# Patient Record
Sex: Male | Born: 2000 | Race: Black or African American | Hispanic: No | Marital: Single | Smoking: Never smoker
Health system: Southern US, Community
[De-identification: ages and names within clinical notes are randomized; demographics above are authoritative.]

---
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
## 2020-07-27 IMAGING — CR DG CHEST 2V
1 series · 2 of 2 positions shown · non-contrast
Comparison: None.

CLINICAL DATA: Chest pain

EXAM:
CHEST - 2 VIEW

[Series 1: dg chest 2 view · 0.14mm/px · 2 of 2 slices shown]
[im 1/2]
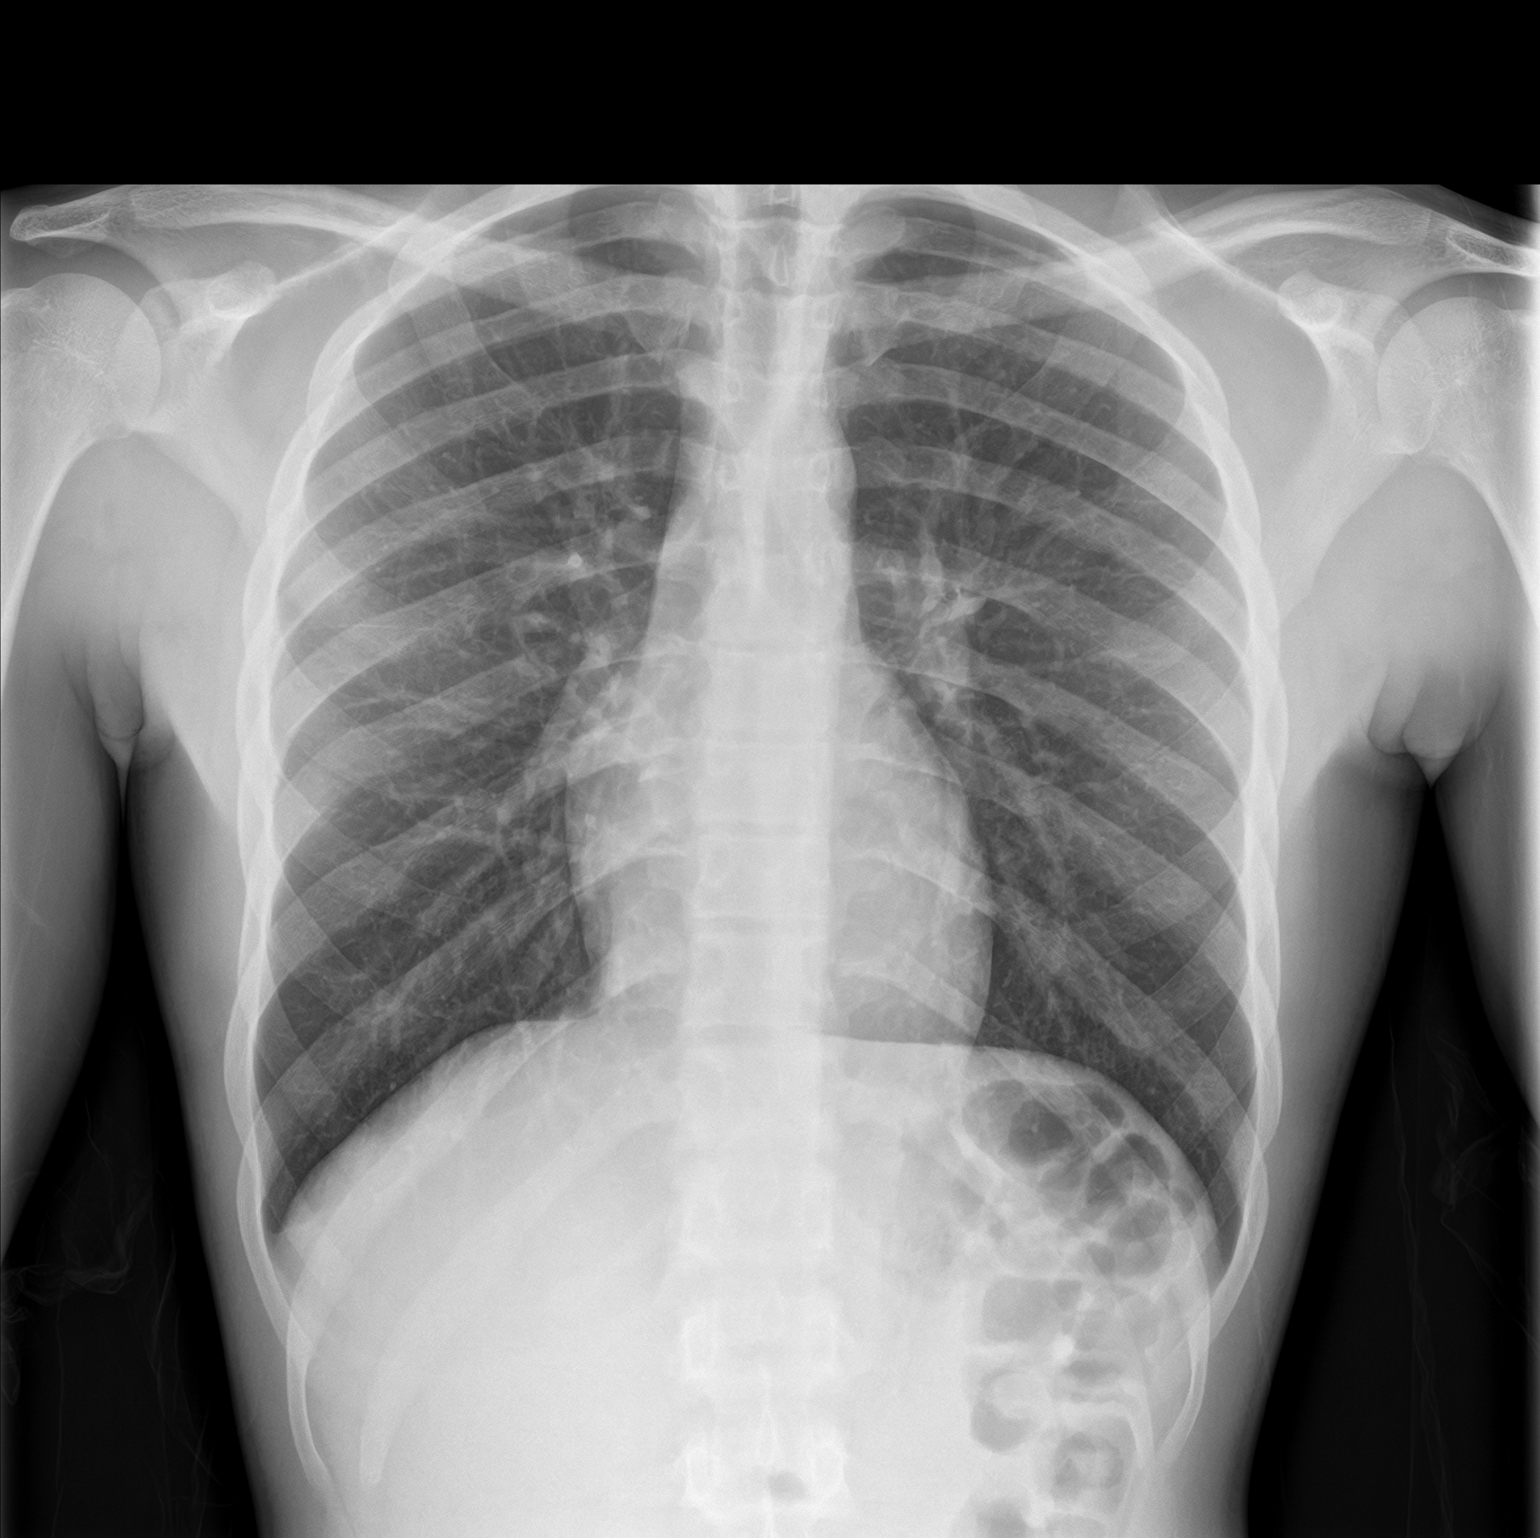
[im 2/2]
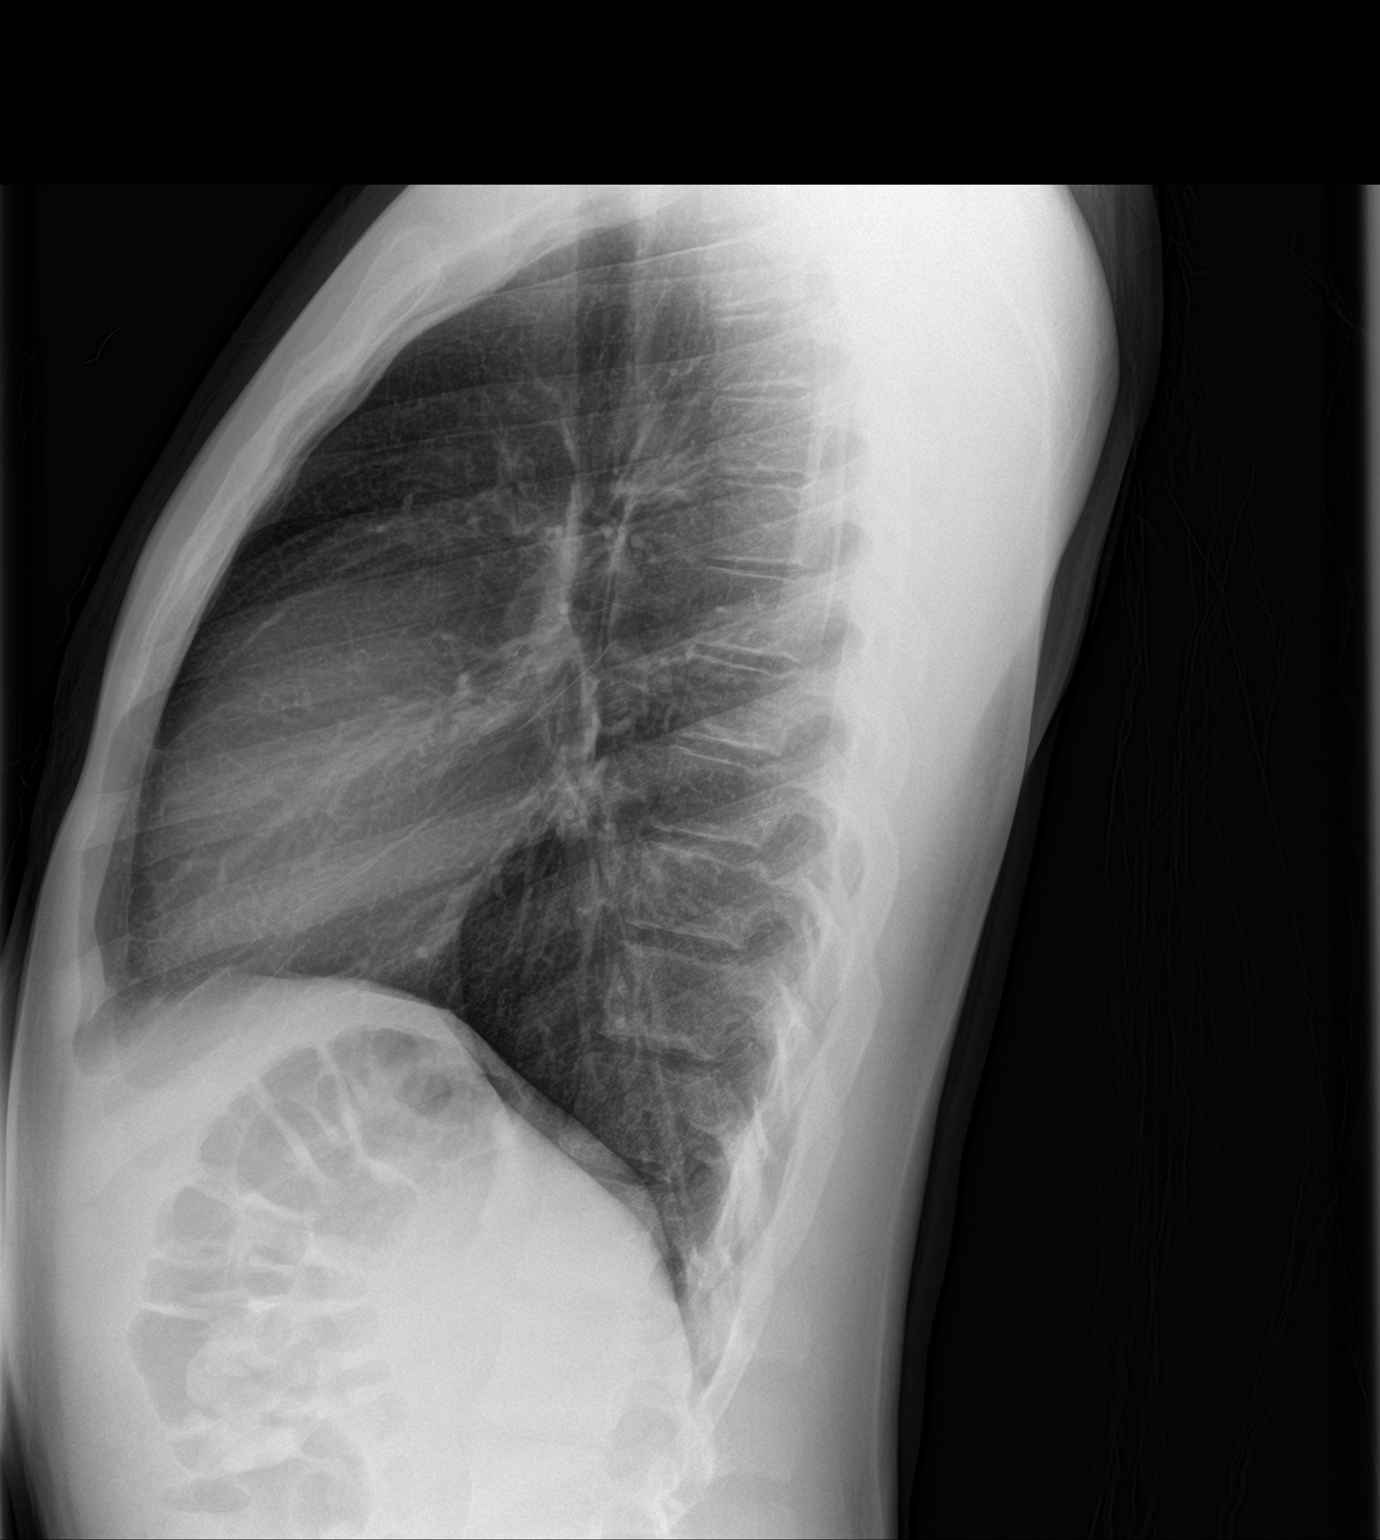

[2 of 2 positions shown; findings below may reference images not displayed]

FINDINGS: The heart size and mediastinal contours are within normal limits.
Both lungs are clear. The visualized skeletal structures are
unremarkable.
IMPRESSION: No acute cardiopulmonary disease.

## 2020-08-09 ENCOUNTER — Encounter: Payer: Self-pay | Admitting: Physician Assistant

## 2020-08-09 ENCOUNTER — Other Ambulatory Visit: Payer: Self-pay

## 2020-08-09 ENCOUNTER — Ambulatory Visit: Payer: Medicaid Other | Admitting: Physician Assistant

## 2020-08-09 DIAGNOSIS — A539 Syphilis, unspecified: Secondary | ICD-10-CM

## 2020-08-09 DIAGNOSIS — Z113 Encounter for screening for infections with a predominantly sexual mode of transmission: Secondary | ICD-10-CM | POA: Diagnosis not present

## 2020-08-09 MED ORDER — PENICILLIN G BENZATHINE 1200000 UNIT/2ML IM SUSP
2.4000 10*6.[IU] | INTRAMUSCULAR | Status: AC
  Administered 2020-08-09 – 2020-08-26 (×3): 2.4 10*6.[IU] via INTRAMUSCULAR

## 2020-08-09 NOTE — Progress Notes (Signed)
   [facility] Department STI clinic/screening visit  Subjective:  Keith Mccoy is a 20 y.o. male being seen today for an STI screening visit. The patient reports they do not have symptoms.    Patient has the following medical conditions:   Patient Active Problem List   Diagnosis Date Noted  . Syphilis 08/09/2020     Chief Complaint  Patient presents with  . SEXUALLY TRANSMITTED DISEASE    treatment    HPI  Patient reports that she was sent here to complete treatment for Syphilis. States that she was seen 2-3 weeks ago and tested positive for Syphilis.  States that she received 2 shots and was then told to come here to finish the treatment.  States that she talked with [place], from the state.   See flowsheet for further details and programmatic requirements.    The following portions of the patient's history were reviewed and updated as appropriate: allergies, current medications, past medical history, past social history, past surgical history and problem list.  Objective:  There were no vitals filed for this visit.  Physical Exam Constitutional:      General: He is not in acute distress.    Appearance: Normal appearance.  HENT:     Head: Normocephalic and atraumatic.  Eyes:     Conjunctiva/sclera: Conjunctivae normal.  Pulmonary:     Effort: Pulmonary effort is normal.  Skin:    General: Skin is warm and dry.  Neurological:     Mental Status: He is alert.  Psychiatric:        Mood and Affect: Mood normal.        Behavior: Behavior normal.        Thought Content: Thought content normal.        Judgment: Judgment normal.       Assessment and Plan:  Keith Mccoy is a 20 y.o. male presenting to the [facility] Department for STI screening  1. Screening for STD (sexually transmitted disease) Reviewed with patient Syphilis- transmission, symptoms, asymptomatic infection, sequelae if not treated. Per DIS report, patient with RPR  reactive 1:2 with confirmatory positive, no history of negative testing within the last year and no history of lesions, rash, or hair loss.   2. Syphilis Will treat for Syphilis of unknown duration with Bicillin 2.4 mu IM q week for 3 weeks. Reviewed with patient SE of injections and to use OTC analgesics and warm compresses for SE. RTC in 1 week and 2 weeks for completion of treatment. - penicillin g benzathine (BICILLIN LA) 1200000 UNIT/2ML injection 2.4 Million Units     Return in about 1 week (around 08/16/2020).  Future Appointments  Date Time Provider Department Center  08/16/2020 10:30 AM [facility] NURSE [facility] None  08/23/2020 10:30 AM [facility] NURSE [facility] None    Matt Holmes, PA

## 2020-08-09 NOTE — Progress Notes (Signed)
20 year old here today for treatment of syphilis. Glenna Fellows, RN

## 2020-08-16 ENCOUNTER — Other Ambulatory Visit: Payer: Self-pay

## 2020-08-16 ENCOUNTER — Ambulatory Visit: Payer: Medicaid Other

## 2020-08-16 DIAGNOSIS — A539 Syphilis, unspecified: Secondary | ICD-10-CM

## 2020-08-16 DIAGNOSIS — Z113 Encounter for screening for infections with a predominantly sexual mode of transmission: Secondary | ICD-10-CM | POA: Diagnosis not present

## 2020-08-16 NOTE — Progress Notes (Signed)
In Nurse Clinic for Bicillin #2. Reports no problems from Bicillin injections last week. Bicillin given today per order by C. [place], [place] dated 08/09/2020. Tolerated well. Declined to stay for observation today. Has appt for Bicillin #3 08/23/2020. Advised to call [facility] if any problems. Jerel Shepherd, RN

## 2020-08-23 ENCOUNTER — Ambulatory Visit: Payer: Medicaid Other

## 2020-08-26 ENCOUNTER — Ambulatory Visit: Payer: Medicaid Other | Admitting: Physician Assistant

## 2020-08-26 ENCOUNTER — Other Ambulatory Visit: Payer: Self-pay

## 2020-08-26 DIAGNOSIS — A539 Syphilis, unspecified: Secondary | ICD-10-CM

## 2020-08-26 DIAGNOSIS — Z113 Encounter for screening for infections with a predominantly sexual mode of transmission: Secondary | ICD-10-CM | POA: Diagnosis not present

## 2020-08-26 NOTE — Progress Notes (Signed)
Pt seen and Bicillin 2.4 MU given without any complications. Berdie Ogren, RN

## 2020-08-26 NOTE — Progress Notes (Signed)
I did not see this patient at today's visit but was available for consultation if needed.  RN saw patient and gave treatment per orders in chart.

## 2021-04-22 ENCOUNTER — Emergency Department
Admission: EM | Admit: 2021-04-22 | Discharge: 2021-04-22 | Disposition: A | Discharge status: Home / Self Care | Payer: Medicaid Other | Attending: Emergency Medicine | Admitting: Emergency Medicine

## 2021-04-22 ENCOUNTER — Other Ambulatory Visit: Payer: Self-pay

## 2021-04-22 DIAGNOSIS — K649 Unspecified hemorrhoids: Secondary | ICD-10-CM | POA: Diagnosis present

## 2021-04-22 DIAGNOSIS — K644 Residual hemorrhoidal skin tags: Secondary | ICD-10-CM | POA: Insufficient documentation

## 2021-04-22 MED ORDER — HYDROCORTISONE ACETATE 25 MG RE SUPP: 25.0000 mg | Freq: Two times a day (BID) | RECTAL | 1 refills | Status: AC

## 2021-04-22 NOTE — ED Triage Notes (Signed)
Pt comes with c/o hemorrhoids. Pt states she noticed them 3 days ago. Pt state pain.

## 2021-04-22 NOTE — ED Provider Notes (Signed)
[facility] Emergency Department Provider Note   ____________________________________________   Event Date/Time   First MD Initiated Contact with Patient 04/22/21 1130     (approximate)  I have reviewed the triage vital signs and the nursing notes.   HISTORY  Chief Complaint Hemorrhoids    HPI Keith Mccoy is a 20 y.o. male who identifies as male presents to the ED complaining of rectal pain.  Patient reports that she has been dealing with about 1 week of increasing pain in her rectal area.  She states that she first noticed a small amount of bleeding after being involved in anal intercourse about 1 week ago.  Bleeding has seemed to improve, but patient now is dealing with ongoing rectal pain that is worse with a bowel movement.  She has not noticed discharge or other drainage from her rectal area and denies any fever or abdominal pain.  She has been using over-the-counter creams with partial relief of pain.        History reviewed. No pertinent past medical history.  Patient Active Problem List   Diagnosis Date Noted   Syphilis 08/09/2020    History reviewed. No pertinent surgical history.  Prior to Admission medications   Medication Sig Start Date End Date Taking? Authorizing Provider  hydrocortisone (ANUSOL-HC) 25 MG suppository Place 1 suppository (25 mg total) rectally every 12 (twelve) hours. 04/22/21 04/22/22 Yes Chesley Noon, MD  albuterol (VENTOLIN HFA) 108 (90 Base) MCG/ACT inhaler Inhale 2 puffs into the lungs every 6 (six) hours as needed for wheezing or shortness of breath. 01/16/19   Sharyn Creamer, MD  predniSONE (STERAPRED UNI-PAK 21 TAB) 10 MG (21) TBPK tablet Take 6 tablets on the first day and decrease by 1 tablet each day until finished. 03/04/19   Chinita Pester, FNP    Allergies Patient has no known allergies.  No family history on file.  Social History Social History   Tobacco Use   Smoking status: Never    Smokeless tobacco: Never  Substance Use Topics   Alcohol use: Yes   Drug use: Yes    Types: Marijuana    Review of Systems  Constitutional: No fever/chills Eyes: No visual changes. ENT: No sore throat. Cardiovascular: Denies chest pain. Respiratory: Denies shortness of breath. Gastrointestinal: No abdominal pain.  No nausea, no vomiting.  No diarrhea.  No constipation.  Positive for rectal pain. Genitourinary: Negative for dysuria. Musculoskeletal: Negative for back pain. Skin: Negative for rash. Neurological: Negative for headaches, focal weakness or numbness.  ____________________________________________   PHYSICAL EXAM:  VITAL SIGNS: ED Triage Vitals  Enc Vitals Group     BP 04/22/21 1029 (!) 143/85     Pulse Rate 04/22/21 1029 94     Resp 04/22/21 1029 16     Temp 04/22/21 1029 98.5 F (36.9 C)     Temp Source 04/22/21 1029 Oral     SpO2 04/22/21 1029 100 %     Weight --      Height --      Head Circumference --      Peak Flow --      Pain Score 04/22/21 1024 6     Pain Loc --      Pain Edu? --      Excl. in GC? --     Constitutional: Alert and oriented. Eyes: Conjunctivae are normal. Head: Atraumatic. Nose: No congestion/rhinnorhea. Mouth/Throat: Mucous membranes are moist. Neck: Normal ROM Cardiovascular: Normal rate, regular rhythm. Grossly normal  heart sounds. Respiratory: Normal respiratory effort.  No retractions. Lungs CTAB. Gastrointestinal: Soft and nontender. No distention.  Small external hemorrhoid noted with signs of recent bleeding but no active bleeding. Genitourinary: deferred Musculoskeletal: No lower extremity tenderness nor edema. Neurologic:  Normal speech and language. No gross focal neurologic deficits are appreciated. Skin:  Skin is warm, dry and intact. No rash noted. Psychiatric: Mood and affect are normal. Speech and behavior are normal.  ____________________________________________   LABS (all labs ordered are listed, but  only abnormal results are displayed)  Labs Reviewed - No data to display   PROCEDURES  Procedure(s) performed (including Critical Care):  Procedures   ____________________________________________   INITIAL IMPRESSION / ASSESSMENT AND PLAN / ED COURSE      20 year old male who identifies as male presents to the ED complaining of 1 week of rectal discomfort associated with bleeding earlier in the week that has since resolved.  Patient is sexually active and engages in anal intercourse but there are no signs of proctitis at this time.  Symptoms appear due to a nonbleeding external hemorrhoid and she is appropriate for outpatient management.  She will be prescribed Anusol, was also counseled to take Tylenol and ibuprofen as needed.  She was counseled to follow-up with her PCP and to return to the ED for new worsening symptoms, patient agrees with plan.      ____________________________________________   FINAL CLINICAL IMPRESSION(S) / ED DIAGNOSES  Final diagnoses:  Hemorrhoids, unspecified hemorrhoid type     ED Discharge Orders          Ordered    hydrocortisone (ANUSOL-HC) 25 MG suppository  Every 12 hours        04/22/21 1224             Note:  This document was prepared using Dragon voice recognition software and may include unintentional dictation errors.    Chesley Noon, MD 04/22/21 [phone]
